# Patient Record
Sex: Male | Born: 1980 | Race: White | Hispanic: No | Marital: Married | Smoking: Current every day smoker
Health system: Southern US, Community
[De-identification: ages and names within clinical notes are randomized; demographics above are authoritative.]

---

## 2007-02-20 ENCOUNTER — Emergency Department (HOSPITAL_COMMUNITY): Admission: EM | Admit: 2007-02-20 | Discharge: 2007-02-20 | Payer: Self-pay | Admitting: *Deleted

## 2013-04-21 ENCOUNTER — Emergency Department
Admission: EM | Admit: 2013-04-21 | Discharge: 2013-04-21 | Disposition: A | Discharge status: Home / Self Care | Payer: Managed Care, Other (non HMO) | Source: Home / Self Care | Attending: Family Medicine | Admitting: Family Medicine

## 2013-04-21 ENCOUNTER — Encounter: Payer: Self-pay | Admitting: Emergency Medicine

## 2013-04-21 DIAGNOSIS — Z23 Encounter for immunization: Secondary | ICD-10-CM

## 2013-04-21 DIAGNOSIS — T6391XA Toxic effect of contact with unspecified venomous animal, accidental (unintentional), initial encounter: Secondary | ICD-10-CM

## 2013-04-21 MED ORDER — TRIAMCINOLONE ACETONIDE 40 MG/ML IJ SUSP
40.0000 mg | Freq: Once | INTRAMUSCULAR | Status: AC
  Administered 2013-04-21: 40 mg via INTRAMUSCULAR

## 2013-04-21 MED ORDER — DIPHENHYDRAMINE HCL 50 MG/ML IJ SOLN
50.0000 mg | Freq: Once | INTRAMUSCULAR | Status: AC
  Administered 2013-04-21: 50 mg via INTRAMUSCULAR

## 2013-04-21 MED ORDER — TETANUS-DIPHTH-ACELL PERTUSSIS 5-2.5-18.5 LF-MCG/0.5 IM SUSP
0.5000 mL | Freq: Once | INTRAMUSCULAR | Status: AC
  Administered 2013-04-21: 0.5 mL via INTRAMUSCULAR

## 2013-04-21 NOTE — ED Notes (Signed)
Reports getting stung by yellow jackets x 2 in left hand and left lower leg about one hour ago; in past has incurred large edema from stings with denial of anaphylactic reaction. Has not taken OTC or applied ice.
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
## 2016-01-28 ENCOUNTER — Emergency Department (HOSPITAL_COMMUNITY): Payer: Managed Care, Other (non HMO)

## 2016-01-28 ENCOUNTER — Encounter (HOSPITAL_COMMUNITY): Payer: Self-pay | Admitting: Emergency Medicine

## 2016-01-28 ENCOUNTER — Emergency Department (HOSPITAL_COMMUNITY)
Admission: EM | Admit: 2016-01-28 | Discharge: 2016-01-28 | Disposition: A | Discharge status: Home / Self Care | Payer: Managed Care, Other (non HMO) | Attending: Emergency Medicine | Admitting: Emergency Medicine

## 2016-01-28 DIAGNOSIS — S7002XA Contusion of left hip, initial encounter: Secondary | ICD-10-CM

## 2016-01-28 DIAGNOSIS — Y998 Other external cause status: Secondary | ICD-10-CM | POA: Insufficient documentation

## 2016-01-28 DIAGNOSIS — Z79899 Other long term (current) drug therapy: Secondary | ICD-10-CM | POA: Insufficient documentation

## 2016-01-28 DIAGNOSIS — Y9389 Activity, other specified: Secondary | ICD-10-CM | POA: Insufficient documentation

## 2016-01-28 DIAGNOSIS — Y92410 Unspecified street and highway as the place of occurrence of the external cause: Secondary | ICD-10-CM | POA: Insufficient documentation

## 2016-01-28 DIAGNOSIS — T07XXXA Unspecified multiple injuries, initial encounter: Secondary | ICD-10-CM

## 2016-01-28 DIAGNOSIS — F17210 Nicotine dependence, cigarettes, uncomplicated: Secondary | ICD-10-CM | POA: Diagnosis not present

## 2016-01-28 DIAGNOSIS — S20419A Abrasion of unspecified back wall of thorax, initial encounter: Secondary | ICD-10-CM | POA: Insufficient documentation

## 2016-01-28 DIAGNOSIS — S79912A Unspecified injury of left hip, initial encounter: Secondary | ICD-10-CM | POA: Diagnosis present

## 2016-01-28 LAB — COMPREHENSIVE METABOLIC PANEL
ALBUMIN: 4.1 g/dL (ref 3.5–5.0)
ALT: 16 U/L — ABNORMAL LOW (ref 17–63)
ANION GAP: 10 (ref 5–15)
AST: 40 U/L (ref 15–41)
Alkaline Phosphatase: 77 U/L (ref 38–126)
BUN: 12 mg/dL (ref 6–20)
CO2: 26 mmol/L (ref 22–32)
Calcium: 9.1 mg/dL (ref 8.9–10.3)
Chloride: 102 mmol/L (ref 101–111)
Creatinine, Ser: 1.34 mg/dL — ABNORMAL HIGH (ref 0.61–1.24)
GFR calc Af Amer: >60 mL/min (ref >60)
GFR calc non Af Amer: >60 mL/min (ref >60)
GLUCOSE: 126 mg/dL — AB (ref 65–99)
POTASSIUM: 4 mmol/L (ref 3.5–5.1)
SODIUM: 138 mmol/L (ref 135–145)
Total Bilirubin: 1.2 mg/dL (ref 0.3–1.2)
Total Protein: 6.6 g/dL (ref 6.5–8.1)

## 2016-01-28 LAB — CBC
HCT: 51.2 % (ref 39.0–52.0)
Hemoglobin: 17.6 g/dL — ABNORMAL HIGH (ref 13.0–17.0)
MCH: 32.2 pg (ref 26.0–34.0)
MCHC: 34.4 g/dL (ref 30.0–36.0)
MCV: 93.8 fL (ref 78.0–100.0)
PLATELETS: 227 10*3/uL (ref 150–400)
RBC: 5.46 MIL/uL (ref 4.22–5.81)
RDW: 12.2 % (ref 11.5–15.5)
WBC: 13.9 10*3/uL — AB (ref 4.0–10.5)

## 2016-01-28 LAB — LIPASE, BLOOD: Lipase: 50 U/L (ref 11–51)

## 2016-01-28 MED ORDER — HYDROMORPHONE HCL 1 MG/ML IJ SOLN
INTRAMUSCULAR | Status: AC
  Administered 2016-01-28: 1 mg via INTRAVENOUS
  Filled 2016-01-28: qty 1

## 2016-01-28 MED ORDER — ONDANSETRON HCL 4 MG/2ML IJ SOLN
INTRAMUSCULAR | Status: AC
  Filled 2016-01-28: qty 2

## 2016-01-28 MED ORDER — SODIUM CHLORIDE 0.9 % IV SOLN
INTRAVENOUS | Status: AC | PRN
  Administered 2016-01-28: 500 mL via INTRAVENOUS

## 2016-01-28 MED ORDER — SODIUM CHLORIDE 0.9 % IV BOLUS (SEPSIS)
1000.0000 mL | Freq: Once | INTRAVENOUS | Status: AC
  Administered 2016-01-28: 1000 mL via INTRAVENOUS

## 2016-01-28 MED ORDER — HYDROMORPHONE HCL 1 MG/ML IJ SOLN
INTRAMUSCULAR | Status: AC
  Filled 2016-01-28: qty 1

## 2016-01-28 MED ORDER — HYDROMORPHONE HCL 1 MG/ML IJ SOLN
INTRAMUSCULAR | Status: AC | PRN
  Administered 2016-01-28 (×2): 1 mg via INTRAVENOUS

## 2016-01-28 MED ORDER — ONDANSETRON HCL 4 MG/2ML IJ SOLN
4.0000 mg | Freq: Once | INTRAMUSCULAR | Status: AC
  Administered 2016-01-28: 4 mg via INTRAVENOUS

## 2016-01-28 MED ORDER — OXYCODONE-ACETAMINOPHEN 5-325 MG PO TABS: 1.0000 | ORAL_TABLET | ORAL | Status: DC | PRN

## 2016-01-28 MED ORDER — CYCLOBENZAPRINE HCL 10 MG PO TABS: 10.0000 mg | ORAL_TABLET | Freq: Two times a day (BID) | ORAL | Status: DC | PRN

## 2016-01-28 MED ORDER — ONDANSETRON HCL 4 MG PO TABS: 4.0000 mg | ORAL_TABLET | Freq: Three times a day (TID) | ORAL | Status: DC | PRN
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
## 2016-01-28 NOTE — ED Notes (Signed)
Pt. given paper scrubs and no slip socks .

## 2016-01-28 NOTE — Progress Notes (Signed)
Orthopedic Tech Progress Note Patient Details:  Emeline GinsJerry Seigler 03/12/1981 [id number]  Ortho Devices Type of Ortho Device: Crutches Ortho Device/Splint Interventions: Ordered, Adjustment   [name], Shivam Mestas Craig 01/28/2016, 10:54 PM

## 2016-01-28 NOTE — Progress Notes (Signed)
Orthopedic Tech Progress Note Patient Details:  Francis Ferguson 02/27/1981 [id number]  Ortho Devices Type of Ortho Device: Crutches Ortho Device/Splint Interventions: Ordered, Adjustment   Shantanu Strauch Craig 01/28/2016, 10:53 PM  
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
## 2016-01-28 NOTE — Progress Notes (Signed)
Orthopedic Tech Progress Note Patient Details:  Francis GinsJerry Ferguson 09-27-1981 [id number]  Ortho Devices Type of Ortho Device: Crutches Ortho Device/Splint Interventions: Ordered, Adjustment   [name], [name] 01/28/2016, 10:53 PM

## 2016-01-28 NOTE — Discharge Instructions (Signed)

## 2016-01-29 ENCOUNTER — Encounter: Payer: Self-pay | Admitting: Emergency Medicine
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
## 2017-11-30 ENCOUNTER — Ambulatory Visit: Payer: Managed Care, Other (non HMO) | Admitting: Podiatry

## 2017-11-30 ENCOUNTER — Encounter: Payer: Self-pay | Admitting: Podiatry

## 2017-11-30 ENCOUNTER — Ambulatory Visit (INDEPENDENT_AMBULATORY_CARE_PROVIDER_SITE_OTHER): Payer: Managed Care, Other (non HMO)

## 2017-11-30 DIAGNOSIS — M722 Plantar fascial fibromatosis: Secondary | ICD-10-CM

## 2017-11-30 MED ORDER — METHYLPREDNISOLONE 4 MG PO TBPK: ORAL_TABLET | ORAL | 0 refills | Status: DC

## 2017-11-30 MED ORDER — PREGABALIN 50 MG PO CAPS: 50.0000 mg | ORAL_CAPSULE | Freq: Two times a day (BID) | ORAL | 0 refills | Status: DC

## 2017-11-30 NOTE — Patient Instructions (Signed)

## 2017-11-30 NOTE — Progress Notes (Signed)
Subjective:    Patient ID: Francis Ferguson, male    DOB: 1981-02-18, 37 y.o.   MRN: [id number]  HPI  37 year old male presents the office today for concerns of left heel pain which is been ongoing for about 1 year.  He states he has pain in the bottom of his heel when he first gets up in the morning or after work he comes home and sits down and stands back up.  He denies any recent injury or trauma.  He states that it did start when he had a wearing new boots at work.  He denies any recent injury or trauma.  No numbness or tingling.  He has had no treatment.  Pain does not wake him up at night.  He has no other concerns today.    Review of Systems  All other systems reviewed and are negative.  History reviewed. No pertinent past medical history.  History reviewed. No pertinent surgical history.   Current Outpatient Medications:  .  cetirizine (ZYRTEC) 10 MG tablet, Take 10 mg by mouth daily., Disp: , Rfl:  .  cyclobenzaprine (FLEXERIL) 10 MG tablet, Take 1 tablet (10 mg total) by mouth 2 (two) times daily as needed for muscle spasms. (Patient not taking: Reported on 11/30/2017), Disp: 20 tablet, Rfl: 0 .  methylPREDNISolone (MEDROL DOSEPAK) 4 MG TBPK tablet, Take as directed, Disp: 21 tablet, Rfl: 0 .  omeprazole (PRILOSEC) 20 MG capsule, Take 20 mg by mouth daily., Disp: , Rfl:  .  ondansetron (ZOFRAN) 4 MG tablet, Take 1 tablet (4 mg total) by mouth every 8 (eight) hours as needed for nausea or vomiting. (Patient not taking: Reported on 11/30/2017), Disp: 12 tablet, Rfl: 0 .  oxyCODONE-acetaminophen (PERCOCET) 5-325 MG tablet, Take 1-2 tablets by mouth every 4 (four) hours as needed. (Patient not taking: Reported on 11/30/2017), Disp: 12 tablet, Rfl: 0  No Known Allergies  Social History   Socioeconomic History  . Marital status: Married    Spouse name: Not on file  . Number of children: Not on file  . Years of education: Not on file  . Highest education level: Not on file  Social Needs    . Financial resource strain: Not on file  . Food insecurity - worry: Not on file  . Food insecurity - inability: Not on file  . Transportation needs - medical: Not on file  . Transportation needs - non-medical: Not on file  Occupational History  . Not on file  Tobacco Use  . Smoking status: Current Every Day Smoker  . Smokeless tobacco: Never Used  Substance and Sexual Activity  . Alcohol use: Yes  . Drug use: No  . Sexual activity: Not on file  Other Topics Concern  . Not on file  Social History Narrative   ** Merged History Encounter **           Objective:   Physical Exam General: AAO x3, NAD  Dermatological: Skin is warm, dry and supple bilateral. Nails x 10 are well manicured; remaining integument appears unremarkable at this time. There are no open sores, no preulcerative lesions, no rash or signs of infection present.  Vascular: Dorsalis Pedis artery and Posterior Tibial artery pedal pulses are 2/4 bilateral with immedate capillary fill time. Pedal hair growth present. No varicosities and no lower extremity edema present bilateral. There is no pain with calf compression, swelling, warmth, erythema.   Neruologic: Grossly intact via light touch bilateral. Protective threshold with Semmes Wienstein monofilament intact  to all pedal sites bilateral.  Negative Tinel sign present.  Musculoskeletal: Tenderness to palpation along the plantar medial tubercle of the calcaneus at the insertion of plantar fascia on the left foot. There is no pain along the course of the plantar fascia within the arch of the foot. Plantar fascia appears to be intact. There is no pain with lateral compression of the calcaneus or pain with vibratory sensation. There is no pain along the course or insertion of the achilles tendon. No other areas of tenderness to bilateral lower extremities.. Muscular strength 5/5 in all groups tested bilateral.  Gait: Unassisted, Nonantalgic.      Assessment & Plan:   37 year old male left heel pain, likely plantar fasciitis -Treatment options discussed including all alternatives, risks, and complications -Etiology of symptoms were discussed -X-rays were obtained and reviewed with the patient.  No evidence of acute fracture or stress fracture.  No evidence of calcaneal spurring present today. -Steroid injection was performed.  See procedure note below. -Medrol Dosepak. -Discussed stretching, icing exercises daily. -Discussed shoe gear modifications and orthotics -Plantar fascial brace was dispensed -Follow-up in 3 weeks or sooner if needed.  He agrees with plan has no further questions or concerns.  *I inadvertently ordered lyrica for this patient.  To call the pharmacy and cancel this and I spoke to the pharmacist.  Vivi Barrack DPM

## 2018-05-11 DIAGNOSIS — M79641 Pain in right hand: Secondary | ICD-10-CM | POA: Insufficient documentation

## 2018-05-11 DIAGNOSIS — M25531 Pain in right wrist: Secondary | ICD-10-CM | POA: Insufficient documentation

## 2018-05-11 DIAGNOSIS — M65321 Trigger finger, right index finger: Secondary | ICD-10-CM | POA: Insufficient documentation

## 2021-09-01 ENCOUNTER — Other Ambulatory Visit: Payer: Self-pay

## 2021-09-01 ENCOUNTER — Ambulatory Visit: Payer: Managed Care, Other (non HMO) | Admitting: Podiatry

## 2021-09-01 ENCOUNTER — Ambulatory Visit (INDEPENDENT_AMBULATORY_CARE_PROVIDER_SITE_OTHER): Payer: Managed Care, Other (non HMO)

## 2021-09-01 ENCOUNTER — Encounter: Payer: Self-pay | Admitting: Podiatry

## 2021-09-01 DIAGNOSIS — M722 Plantar fascial fibromatosis: Secondary | ICD-10-CM | POA: Diagnosis not present

## 2021-09-01 DIAGNOSIS — M79672 Pain in left foot: Secondary | ICD-10-CM | POA: Diagnosis not present

## 2021-09-01 MED ORDER — TRIAMCINOLONE ACETONIDE 10 MG/ML IJ SUSP
10.0000 mg | Freq: Once | INTRAMUSCULAR | Status: AC
Start: 2021-09-01 — End: 2021-09-01
  Administered 2021-09-01: 10 mg

## 2021-09-01 MED ORDER — MELOXICAM 15 MG PO TABS: 15.0000 mg | ORAL_TABLET | Freq: Every day | ORAL | 0 refills | Status: AC

## 2021-09-01 NOTE — Patient Instructions (Signed)

## 2021-09-04 NOTE — Progress Notes (Signed)
Subjective:   Patient ID: Francis Ferguson, male   DOB: 40 y.o.   MRN: [id number]   HPI 40 year old male presents the office today for concerns of discomfort to the bottom of his left heel which is been on for the last 2 weeks.  He states he has pain when he first gets up he is tried stretching in the morning.  This has helped some.  He may have started as he cannot stand he has to go up a steep hill but no other injury.  No increase in swelling.  No other concerns today.  He was seen for Planter fasciitis in March of May 2019.  Symptoms resolved at that time until last couple weeks when it started to come back.   Review of Systems  All other systems reviewed and are negative.  No past medical history on file.  No past surgical history on file.   Current Outpatient Medications:    cetirizine (ZYRTEC) 10 MG tablet, Take 10 mg by mouth daily., Disp: , Rfl:    meloxicam (MOBIC) 15 MG tablet, Take 1 tablet (15 mg total) by mouth daily., Disp: 30 tablet, Rfl: 0   omeprazole (PRILOSEC) 20 MG capsule, Take 20 mg by mouth daily., Disp: , Rfl:   No Known Allergies       Objective:  Physical Exam  General: AAO x3, NAD  Dermatological: Skin is warm, dry and supple bilateral.  There are no open sores, no preulcerative lesions, no rash or signs of infection present.  Vascular: Dorsalis Pedis artery and Posterior Tibial artery pedal pulses are 2/4 bilateral with immedate capillary fill time. There is no pain with calf compression, swelling, warmth, erythema.   Neruologic: Grossly intact via light touch bilateral.  Negative Tinel sign.  Musculoskeletal: Tenderness to palpation along the plantar medial tubercle of the calcaneus at the insertion of plantar fascia on the left foot. There is no pain along the course of the plantar fascia within the arch of the foot. Plantar fascia appears to be intact. There is no pain with lateral compression of the calcaneus or pain with vibratory sensation. There is  no pain along the course or insertion of the achilles tendon. No other areas of tenderness to bilateral lower extremities. Muscular strength 5/5 in all groups tested bilateral.  Gait: Unassisted, Nonantalgic.       Assessment:   Left heel pain, Plantar fasciitis    Plan:  -Treatment options discussed including all alternatives, risks, and complications -Etiology of symptoms were discussed -X-rays were obtained and reviewed with the patient.  Minute heel spurring is present but not causing symptoms.  No subacute fracture. -Steroid injection performed.  See procedure note below.  Discussed risk and he understands this.  Discussed trying limit activity for the next week after the injection. -Prescribed mobic. Discussed side effects of the medication and directed to stop if any are to occur and call the office.  -Continue stretching, icing daily. -Discussed shoes and good arch supports.    Vivi Barrack DPM

## 2021-10-01 ENCOUNTER — Ambulatory Visit: Payer: Managed Care, Other (non HMO) | Admitting: Podiatry

## 2023-10-19 ENCOUNTER — Encounter (HOSPITAL_BASED_OUTPATIENT_CLINIC_OR_DEPARTMENT_OTHER): Payer: Self-pay

## 2023-10-19 ENCOUNTER — Emergency Department (HOSPITAL_BASED_OUTPATIENT_CLINIC_OR_DEPARTMENT_OTHER): Payer: Managed Care, Other (non HMO)

## 2023-10-19 ENCOUNTER — Emergency Department (HOSPITAL_BASED_OUTPATIENT_CLINIC_OR_DEPARTMENT_OTHER)
Admission: EM | Admit: 2023-10-19 | Discharge: 2023-10-19 | Disposition: A | Discharge status: Home / Self Care | Payer: Managed Care, Other (non HMO) | Attending: Emergency Medicine | Admitting: Emergency Medicine

## 2023-10-19 ENCOUNTER — Other Ambulatory Visit: Payer: Self-pay

## 2023-10-19 DIAGNOSIS — J09X2 Influenza due to identified novel influenza A virus with other respiratory manifestations: Secondary | ICD-10-CM | POA: Insufficient documentation

## 2023-10-19 DIAGNOSIS — J101 Influenza due to other identified influenza virus with other respiratory manifestations: Secondary | ICD-10-CM

## 2023-10-19 DIAGNOSIS — Z20822 Contact with and (suspected) exposure to covid-19: Secondary | ICD-10-CM | POA: Insufficient documentation

## 2023-10-19 DIAGNOSIS — R0602 Shortness of breath: Secondary | ICD-10-CM | POA: Diagnosis present

## 2023-10-19 LAB — RESP PANEL BY RT-PCR (RSV, FLU A&B, COVID)  RVPGX2
Influenza A by PCR: POSITIVE — AB
Influenza B by PCR: NEGATIVE
Resp Syncytial Virus by PCR: NEGATIVE
SARS Coronavirus 2 by RT PCR: NEGATIVE

## 2023-10-19 MED ORDER — ACETAMINOPHEN 325 MG PO TABS
650.0000 mg | ORAL_TABLET | Freq: Once | ORAL | Status: AC
  Administered 2023-10-19: 650 mg via ORAL
  Filled 2023-10-19: qty 2

## 2023-10-19 MED ORDER — ALBUTEROL SULFATE HFA 108 (90 BASE) MCG/ACT IN AERS
1.0000 | INHALATION_SPRAY | Freq: Once | RESPIRATORY_TRACT | Status: AC
  Administered 2023-10-19: 1 via RESPIRATORY_TRACT
  Filled 2023-10-19: qty 6.7

## 2023-10-19 MED ORDER — BENZONATATE 100 MG PO CAPS: 100.0000 mg | ORAL_CAPSULE | Freq: Three times a day (TID) | ORAL | 0 refills | Status: AC

## 2023-10-19 MED ORDER — OSELTAMIVIR PHOSPHATE 75 MG PO CAPS: 75.0000 mg | ORAL_CAPSULE | Freq: Two times a day (BID) | ORAL | 0 refills | Status: AC

## 2023-10-19 MED ORDER — ALBUTEROL SULFATE (2.5 MG/3ML) 0.083% IN NEBU
2.5000 mg | INHALATION_SOLUTION | Freq: Once | RESPIRATORY_TRACT | Status: AC
  Administered 2023-10-19: 2.5 mg via RESPIRATORY_TRACT
  Filled 2023-10-19: qty 3
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
## 2023-10-19 NOTE — ED Triage Notes (Signed)
Pt c/o shortness of breath, headache, body aches, fever that started at 12:45 today

## 2023-10-19 NOTE — ED Provider Notes (Signed)
[facility] Provider Note   CSN: [id number] Arrival date & time: 10/19/23  2052     History  Chief Complaint  Patient presents with   Shortness of Breath   Generalized Body Aches   Fever   Headache    Francis Ferguson is a 43 y.o. male without significant past medical history reporting to the emergency room with cough that has been going on for 1 and half to 2 weeks then recent onset of fever, fatigue, generalized muscle aches.  He does report some shortness of breath when he is up walking around.  He attributes this to just feeling very fatigued.  Denies any chest pain, swelling in feet and ankles, abdominal pain, nausea vomiting diarrhea.  Has not tried anything for the symptoms.  Is not a smoker.  No past medical history of asthma or COPD.   Shortness of Breath Associated symptoms: fever and headaches   Fever Associated symptoms: headaches   Headache Associated symptoms: fever        Home Medications Prior to Admission medications   Medication Sig Start Date End Date Taking? Authorizing Provider  cetirizine (ZYRTEC) 10 MG tablet Take 10 mg by mouth daily.    [provider]  omeprazole (PRILOSEC) 20 MG capsule Take 20 mg by mouth daily.    [provider]      Allergies    Patient has no known allergies.    Review of Systems   Review of Systems  Constitutional:  Positive for fever.  Respiratory:  Positive for shortness of breath.   Neurological:  Positive for headaches.    Physical Exam Updated Vital Signs BP 125/80 (BP Location: Left Arm)   Pulse 100   Temp (!) 101.8 F (38.8 C)   Resp 18   Ht 6' (1.829 m)   Wt 104.3 kg   SpO2 95%   BMI 31.19 kg/m  Physical Exam Vitals and nursing note reviewed.  Constitutional:      General: He is not in acute distress.    Appearance: He is not toxic-appearing.  HENT:     Head: Normocephalic and atraumatic.  Eyes:     General: No scleral icterus.     Conjunctiva/sclera: Conjunctivae normal.  Cardiovascular:     Rate and Rhythm: Normal rate and regular rhythm.     Pulses: Normal pulses.     Heart sounds: Normal heart sounds.  Pulmonary:     Effort: Pulmonary effort is normal. No respiratory distress.     Breath sounds: Normal breath sounds.  Abdominal:     General: Abdomen is flat. Bowel sounds are normal.     Palpations: Abdomen is soft.     Tenderness: There is no abdominal tenderness.  Musculoskeletal:     Right lower leg: No edema.     Left lower leg: No edema.  Skin:    General: Skin is warm and dry.     Findings: No lesion.  Neurological:     General: No focal deficit present.     Mental Status: He is alert and oriented to person, place, and time. Mental status is at baseline.     ED Results / Procedures / Treatments   Labs (all labs ordered are listed, but only abnormal results are displayed) Labs Reviewed  RESP PANEL BY RT-PCR (RSV, FLU A&B, COVID)  [id number]    EKG None  Radiology No results found.  Procedures Procedures    Medications Ordered in ED Medications  acetaminophen (TYLENOL) tablet 650 mg (650 mg Oral Given 10/19/23 2106)    ED Course/ Medical Decision Making/ A&P                                 Medical Decision Making Amount and/or Complexity of Data Reviewed Radiology: ordered.  Risk OTC drugs. Prescription drug management.   Francis Ferguson 43 y.o. presented today for URI like symptoms. Working DDx that I considered at this time includes, but not limited to, viral illness, pharyngitis, mono, sinusitis, electrolyte abnormality, AOM.  R/o DDx: these additional diagnoses are not consistent with patient's history, presentation, physical exam, labs/imaging findings.  Review of prior external notes: Denies   Labs:  Respiratory Panel: influenza A   Imaging: Chest x-ray given duration of cough. No acute findings, no pneumonia   Problem List / ED Course / Critical interventions /  Medication management  Reporting to emergency room with complaint of cough associated shortness of breath flulike symptoms.  Given duration of symptoms of cough will rule out pneumonia with chest x-ray.  Otherwise obtaining respiratory panel.  Patient hemodynamically stable well-appearing not tachycardic not hypoxic.  He does have fever which we will treat with Tylenol.  No sign of distress on exam, lungs clear to auscultation bilaterally.  Will try albuterol for shortness of breath.  Will closely reassess over feel patient will likely be stable for discharge home pending improvement in fever and chest x-ray. Patient is feeling better after breathing treatment.  He also has improved temperature after Tylenol.  Given appropriate response to treatment we will send him home with albuterol inhaler. Symptoms consistent with flu A, given return precautions.  Patient not tachycardic on arrival.  Was slightly tachycardic after breathing treatment.Stable for discharge.  I ordered medication including Tylenol for fever, albuterol for shortness of breath Reevaluation of the patient after these medicines showed that the patient improved Patients vitals assessed. Upon arrival patient is hemodynamically stable.  I have reviewed the patients home medicines and have made adjustments as needed             Final Clinical Impression(s) / ED Diagnoses Final diagnoses:  Influenza A    Rx / DC Orders ED Discharge Orders     None         Raford Pitcher Evalee Jefferson 10/19/23 2242    Charlynne Pander, MD 10/19/23 [phone]

## 2023-10-19 NOTE — Discharge Instructions (Addendum)
You were seen in the emergency room today for flulike symptoms.  Please alternate Tylenol and ibuprofen for fever and muscle aches.  Make sure staying well-hydrated with water he can alternate Pedialyte and Gatorade.  Please use  inhaler as needed for shortness of breath.  Please return to emergency room if you have any new or worsening symptoms. I sent Tamiflu, as discussed.   I have also sent Tessalon, Tessalon to take as needed for cough.
